# Patient Record
Sex: Male | Born: 1955 | Race: White | Hispanic: No | Marital: Married | Smoking: Never smoker
Health system: Southern US, Community
[De-identification: ages and names within clinical notes are randomized; demographics above are authoritative.]

## PROBLEM LIST (undated history)

## (undated) DIAGNOSIS — I10 Essential (primary) hypertension: Secondary | ICD-10-CM

## (undated) HISTORY — PX: OTHER SURGICAL HISTORY: SHX169

---

## 2015-08-03 ENCOUNTER — Ambulatory Visit
Admission: RE | Admit: 2015-08-03 | Discharge: 2015-08-03 | Disposition: A | Discharge status: Home / Self Care | Payer: PRIVATE HEALTH INSURANCE | Source: Ambulatory Visit | Attending: Nurse Practitioner | Admitting: Nurse Practitioner

## 2015-08-03 ENCOUNTER — Other Ambulatory Visit: Payer: Self-pay | Admitting: Nurse Practitioner

## 2015-08-03 DIAGNOSIS — G542 Cervical root disorders, not elsewhere classified: Secondary | ICD-10-CM

## 2016-07-05 ENCOUNTER — Other Ambulatory Visit: Payer: Self-pay | Admitting: Gastroenterology

## 2016-08-24 ENCOUNTER — Encounter (HOSPITAL_COMMUNITY): Payer: Self-pay

## 2016-08-28 ENCOUNTER — Ambulatory Visit (HOSPITAL_COMMUNITY): Payer: PRIVATE HEALTH INSURANCE | Admitting: Anesthesiology

## 2016-08-28 ENCOUNTER — Ambulatory Visit (HOSPITAL_COMMUNITY)
Admission: RE | Admit: 2016-08-28 | Discharge: 2016-08-28 | Disposition: A | Discharge status: Home / Self Care | Payer: PRIVATE HEALTH INSURANCE | Source: Ambulatory Visit | Attending: Gastroenterology | Admitting: Gastroenterology

## 2016-08-28 ENCOUNTER — Encounter (HOSPITAL_COMMUNITY): Payer: Self-pay

## 2016-08-28 ENCOUNTER — Encounter (HOSPITAL_COMMUNITY)
Admission: RE | Disposition: A | Discharge status: Home / Self Care | Payer: Self-pay | Source: Ambulatory Visit | Attending: Gastroenterology

## 2016-08-28 DIAGNOSIS — I10 Essential (primary) hypertension: Secondary | ICD-10-CM | POA: Insufficient documentation

## 2016-08-28 DIAGNOSIS — Z7982 Long term (current) use of aspirin: Secondary | ICD-10-CM | POA: Diagnosis not present

## 2016-08-28 DIAGNOSIS — E7800 Pure hypercholesterolemia, unspecified: Secondary | ICD-10-CM | POA: Insufficient documentation

## 2016-08-28 DIAGNOSIS — Z79899 Other long term (current) drug therapy: Secondary | ICD-10-CM | POA: Insufficient documentation

## 2016-08-28 DIAGNOSIS — Z880 Allergy status to penicillin: Secondary | ICD-10-CM | POA: Diagnosis not present

## 2016-08-28 DIAGNOSIS — Z1211 Encounter for screening for malignant neoplasm of colon: Secondary | ICD-10-CM | POA: Insufficient documentation

## 2016-08-28 DIAGNOSIS — K5730 Diverticulosis of large intestine without perforation or abscess without bleeding: Secondary | ICD-10-CM | POA: Diagnosis not present

## 2016-08-28 HISTORY — DX: Essential (primary) hypertension: I10

## 2016-08-28 HISTORY — PX: COLONOSCOPY WITH PROPOFOL: SHX5780

## 2016-08-28 SURGERY — COLONOSCOPY WITH PROPOFOL
Anesthesia: Monitor Anesthesia Care

## 2016-08-28 MED ORDER — LIDOCAINE 2% (20 MG/ML) 5 ML SYRINGE
INTRAMUSCULAR | Status: AC
  Filled 2016-08-28: qty 5

## 2016-08-28 MED ORDER — LACTATED RINGERS IV SOLN
INTRAVENOUS | Status: DC
  Administered 2016-08-28: 1000 mL via INTRAVENOUS

## 2016-08-28 MED ORDER — PROPOFOL 500 MG/50ML IV EMUL
INTRAVENOUS | Status: DC | PRN
  Administered 2016-08-28: 150 ug/kg/min via INTRAVENOUS

## 2016-08-28 MED ORDER — PROPOFOL 10 MG/ML IV BOLUS
INTRAVENOUS | Status: DC | PRN
  Administered 2016-08-28: 30 mg via INTRAVENOUS
  Administered 2016-08-28: 20 mg via INTRAVENOUS

## 2016-08-28 MED ORDER — LIDOCAINE 2% (20 MG/ML) 5 ML SYRINGE
INTRAMUSCULAR | Status: DC | PRN
  Administered 2016-08-28: 60 mg via INTRAVENOUS

## 2016-08-28 MED ORDER — PROPOFOL 10 MG/ML IV BOLUS
INTRAVENOUS | Status: AC
  Filled 2016-08-28: qty 60

## 2016-08-28 MED ORDER — SODIUM CHLORIDE 0.9 % IV SOLN: INTRAVENOUS | Status: DC

## 2016-08-28 SURGICAL SUPPLY — 21 items

## 2016-08-28 NOTE — H&P (Signed)
Procedure: Screening colonoscopy. Normal screening colonoscopy was performed on 04/16/2006  History: The patient is a 61 year old male born 06-Jun-1956. He is scheduled to undergo a repeat screening colonoscopy today.  Past medical history: Hypercholesterolemia. Hypertension.  Medication allergies: Penicillin  Exam: The patient is alert and lying comfortably on the endoscopy stretcher. Abdomen is soft and nontender to palpation. Lungs are clear to auscultation. Cardiac exam reveals a regular rhythm.  Plan: Proceed with screening colonoscopy

## 2016-08-28 NOTE — Anesthesia Procedure Notes (Signed)
Procedure Name: MAC Date/Time: 08/28/2016 8:50 AM Performed by: Dione Booze Pre-anesthesia Checklist: Patient identified, Emergency Drugs available, Suction available and Patient being monitored Patient Re-evaluated:Patient Re-evaluated prior to inductionOxygen Delivery Method: Simple face mask Placement Confirmation: positive ETCO2

## 2016-08-28 NOTE — Anesthesia Preprocedure Evaluation (Signed)
Anesthesia Evaluation  Patient identified by MRN, date of birth, ID band Patient awake    Reviewed: Allergy & Precautions, NPO status , Patient's Chart, lab work & pertinent test results  Airway Mallampati: II  TM Distance: >3 FB Neck ROM: Full    Dental  (+) Dental Advisory Given   Pulmonary neg pulmonary ROS,    breath sounds clear to auscultation       Cardiovascular hypertension, Pt. on medications  Rhythm:Regular Rate:Normal     Neuro/Psych negative neurological ROS     GI/Hepatic negative GI ROS, Neg liver ROS,   Endo/Other  negative endocrine ROS  Renal/GU negative Renal ROS     Musculoskeletal   Abdominal   Peds  Hematology negative hematology ROS (+)   Anesthesia Other Findings   Reproductive/Obstetrics                             Anesthesia Physical Anesthesia Plan  ASA: II  Anesthesia Plan: MAC   Post-op Pain Management:    Induction: Intravenous  Airway Management Planned: Simple Face Mask and Natural Airway  Additional Equipment:   Intra-op Plan:   Post-operative Plan:   Informed Consent: I have reviewed the patients History and Physical, chart, labs and discussed the procedure including the risks, benefits and alternatives for the proposed anesthesia with the patient or authorized representative who has indicated his/her understanding and acceptance.     Plan Discussed with: CRNA  Anesthesia Plan Comments:         Anesthesia Quick Evaluation

## 2016-08-28 NOTE — Discharge Instructions (Signed)

## 2016-08-28 NOTE — Transfer of Care (Signed)
Immediate Anesthesia Transfer of Care Note  Patient: Luke Nichols  Procedure(s) Performed: Procedure(s): COLONOSCOPY WITH PROPOFOL (N/A)  Patient Location: PACU and Endoscopy Unit  Anesthesia Type:MAC  Level of Consciousness: sedated and patient cooperative  Airway & Oxygen Therapy: Patient Spontanous Breathing and Patient connected to face mask oxygen  Post-op Assessment: Report given to RN and Post -op Vital signs reviewed and stable  Post vital signs: Reviewed and stable  Last Vitals:  Vitals:   08/28/16 0838  BP: (!) 192/97  Pulse: 88  Resp: 20  Temp: 36.7 C    Last Pain:  Vitals:   08/28/16 0838  TempSrc: Oral         Complications: No apparent anesthesia complications

## 2016-08-28 NOTE — Anesthesia Postprocedure Evaluation (Signed)
Anesthesia Post Note  Patient: Luke Nichols  Procedure(s) Performed: Procedure(s) (LRB): COLONOSCOPY WITH PROPOFOL (N/A)  Patient location during evaluation: PACU Anesthesia Type: MAC Level of consciousness: awake and alert Pain management: pain level controlled Vital Signs Assessment: post-procedure vital signs reviewed and stable Respiratory status: spontaneous breathing, nonlabored ventilation, respiratory function stable and patient connected to nasal cannula oxygen Cardiovascular status: stable and blood pressure returned to baseline Anesthetic complications: no       Last Vitals:  Vitals:   08/28/16 0919 08/28/16 1000  BP:    Pulse: (!) 58 (!) 59  Resp: 14   Temp:      Last Pain:  Vitals:   08/28/16 0838  TempSrc: Leonard Schwartz

## 2016-08-28 NOTE — Op Note (Signed)
[facility] Patient Name: Luke Nichols Procedure Date: 08/28/2016 MRN: [id number] Attending MD: Garlan Fair , MD Date of Birth: 31-Dec-1955 CSN: [id number] Age: 61 Admit Type: Outpatient Procedure:                Colonoscopy Indications:              Screening for colorectal malignant neoplasm Providers:                Garlan Fair, MD, Laverta Baltimore RN, RN,                            Ralene Bathe, Technician Referring MD:              Medicines:                Propofol per Anesthesia Complications:            No immediate complications. Estimated Blood Loss:     Estimated blood loss: none. Procedure:                Pre-Anesthesia Assessment:                           - Prior to the procedure, a History and Physical                            was performed, and patient medications and                            allergies were reviewed. The patient's tolerance of                            previous anesthesia was also reviewed. The risks                            and benefits of the procedure and the sedation                            options and risks were discussed with the patient.                            All questions were answered, and informed consent                            was obtained. Prior Anticoagulants: The patient has                            taken aspirin, last dose was 1 day prior to                            procedure. ASA Grade Assessment: II - A patient                            with mild systemic disease. After reviewing the  risks and benefits, the patient was deemed in                            satisfactory condition to undergo the procedure.                           After obtaining informed consent, the colonoscope                            was passed under direct vision. Throughout the                            procedure, the patient's blood pressure, pulse, and                            oxygen  saturations were monitored continuously. The                            EC-[id number]) scope was introduced through                            the anus and advanced to the the cecum, identified                            by appendiceal orifice and ileocecal valve. The                            colonoscopy was performed without difficulty. The                            patient tolerated the procedure well. The quality                            of the bowel preparation was good. The ileocecal                            valve, the appendiceal orifice and the rectum were                            photographed. Scope In: 8:54:14 AM Scope Out: 9:12:25 AM Scope Withdrawal Time: 0 hours 11 minutes 49 seconds  Total Procedure Duration: 0 hours 18 minutes 11 seconds  Findings:      The perianal and digital rectal examinations were normal.      The entire examined colon appeared normal. Left colonic diverticulosis       was present. Impression:               - The entire examined colon is normal.                           - No specimens collected. Moderate Sedation:      N/A- Per Anesthesia Care Recommendation:           - Patient has a contact number available for  emergencies. The signs and symptoms of potential                            delayed complications were discussed with the                            patient. Return to normal activities tomorrow.                            Written discharge instructions were provided to the                            patient.                           - Repeat colonoscopy in 10 years for screening                            purposes.                           - Resume previous diet.                           - Continue present medications. Procedure Code(s):        --- Professional ---                           [id number], Colorectal cancer screening; colonoscopy on                            individual not meeting  criteria for high risk Diagnosis Code(s):        --- Professional ---                           Z12.11, Encounter for screening for malignant                            neoplasm of colon CPT copyright 2016 [employer]. All rights reserved. The codes documented in this report are preliminary and upon coder review may  be revised to meet current compliance requirements. Earle Gell, MD Garlan Fair, MD 08/28/2016 9:18:22 AM This report has been signed electronically. Number of Addenda: 0

## 2016-08-29 ENCOUNTER — Encounter (HOSPITAL_COMMUNITY): Payer: Self-pay | Admitting: Gastroenterology

## 2016-12-26 ENCOUNTER — Ambulatory Visit (INDEPENDENT_AMBULATORY_CARE_PROVIDER_SITE_OTHER): Payer: PRIVATE HEALTH INSURANCE | Admitting: Podiatry

## 2016-12-26 ENCOUNTER — Ambulatory Visit (INDEPENDENT_AMBULATORY_CARE_PROVIDER_SITE_OTHER): Payer: PRIVATE HEALTH INSURANCE

## 2016-12-26 ENCOUNTER — Encounter: Payer: Self-pay | Admitting: Podiatry

## 2016-12-26 VITALS — Resp 16 | Ht 65.0 in | Wt 190.0 lb

## 2016-12-26 DIAGNOSIS — M79672 Pain in left foot: Secondary | ICD-10-CM

## 2016-12-26 DIAGNOSIS — M722 Plantar fascial fibromatosis: Secondary | ICD-10-CM

## 2016-12-26 MED ORDER — TRIAMCINOLONE ACETONIDE 10 MG/ML IJ SUSP: 10.0000 mg | Freq: Once | INTRAMUSCULAR | Status: AC

## 2016-12-26 MED ORDER — TRIAMCINOLONE ACETONIDE 10 MG/ML IJ SUSP
10.0000 mg | Freq: Once | INTRAMUSCULAR | Status: AC
  Administered 2016-12-26: 10 mg

## 2016-12-26 MED ORDER — DICLOFENAC SODIUM 75 MG PO TBEC: 75.0000 mg | DELAYED_RELEASE_TABLET | Freq: Two times a day (BID) | ORAL | 2 refills | Status: DC

## 2016-12-26 NOTE — Progress Notes (Signed)
Subjective:    Patient ID: Luke Nichols Dec, male   DOB: 61 y.o.   MRN: [id number]   HPI patient presents stating the bottom my left heel has been hurting a lot for over a month and it's getting worse over that time. States she's tried to reduce activity and shoe gear modifications    Review of Systems  All other systems reviewed and are negative.       Objective:  Physical Exam  Constitutional: He is oriented to person, place, and time.  Cardiovascular: Intact distal pulses.   Musculoskeletal: Normal range of motion.  Neurological: He is alert and oriented to person, place, and time.  Skin: Skin is warm.  Nursing note and vitals reviewed.  neurovascular status intact muscle strength was adequate range of motion within normal limits with exquisite discomfort plantar aspect left heel at the insertional point tendon into the calcaneus with inflammation fluid around the medial band. Patient's found have mild depression of the arch and was found have good digital perfusion and well oriented 3     Assessment:   Acute plantar fasciitis left with inflammation fluid around the medial band      Plan:    H&P x-rays reviewed and injected the plantar fashion 3 mg Kenalog 5 mill grams Xylocaine dispensed fascial brace with instructions and gave instructions on physical therapy. Reappoint for [place] to recheck again in the next several weeks and placed on diclofenac 75 mg twice a day  X-ray report indicates spur formation of the small nature with no indication of stress fracture arthritis

## 2016-12-26 NOTE — Progress Notes (Signed)
   Subjective:    Patient ID: Luke Nichols, male    DOB: 07-31-1956, 61 y.o.   MRN: [id number]  HPI Chief Complaint  Patient presents with  . Foot Pain    Left foot; bottom of heel; pt stated, "Hurts more in the morning"; x1+ months      Review of Systems  All other systems reviewed and are negative.      Objective:   Physical Exam        Assessment & Plan:

## 2016-12-26 NOTE — Patient Instructions (Signed)

## 2017-01-09 ENCOUNTER — Ambulatory Visit (INDEPENDENT_AMBULATORY_CARE_PROVIDER_SITE_OTHER): Payer: PRIVATE HEALTH INSURANCE | Admitting: Podiatry

## 2017-01-09 ENCOUNTER — Encounter: Payer: Self-pay | Admitting: Podiatry

## 2017-01-09 DIAGNOSIS — M722 Plantar fascial fibromatosis: Secondary | ICD-10-CM | POA: Diagnosis not present

## 2017-01-09 MED ORDER — TRIAMCINOLONE ACETONIDE 10 MG/ML IJ SUSP
10.0000 mg | Freq: Once | INTRAMUSCULAR | Status: AC
  Administered 2017-01-09: 10 mg

## 2017-01-14 NOTE — Progress Notes (Signed)
Subjective:    Patient ID: Luke Nichols, male   DOB: 61 y.o.   MRN: [id number]   HPI patient states the foot feels better but I still am getting some discomfort    ROS      Objective:  Physical Exam Neurovascular status intact with discomfort on the plantar fascial insertion into the heel left with fluid buildup of a mild to moderate nature    Assessment:    Plantar fasciitis present but improved     Plan:    Injected the left plantar fashion 3 mg Kenalog 5 mg Xylocaine advised on physical therapy and reappoint to recheck

## 2017-03-22 ENCOUNTER — Other Ambulatory Visit: Payer: Self-pay | Admitting: Podiatry

## 2017-03-22 NOTE — Telephone Encounter (Signed)
Pt needs an appt prior to future refills. 

## 2017-11-18 IMAGING — CR DG CERVICAL SPINE 2 OR 3 VIEWS
4 series · 4 of 4 positions shown · non-contrast
Comparison: None.

CLINICAL DATA: LEFT shoulder and elbow pain.

EXAM:
CERVICAL SPINE - 2-3 VIEW

[w c-spine lat]
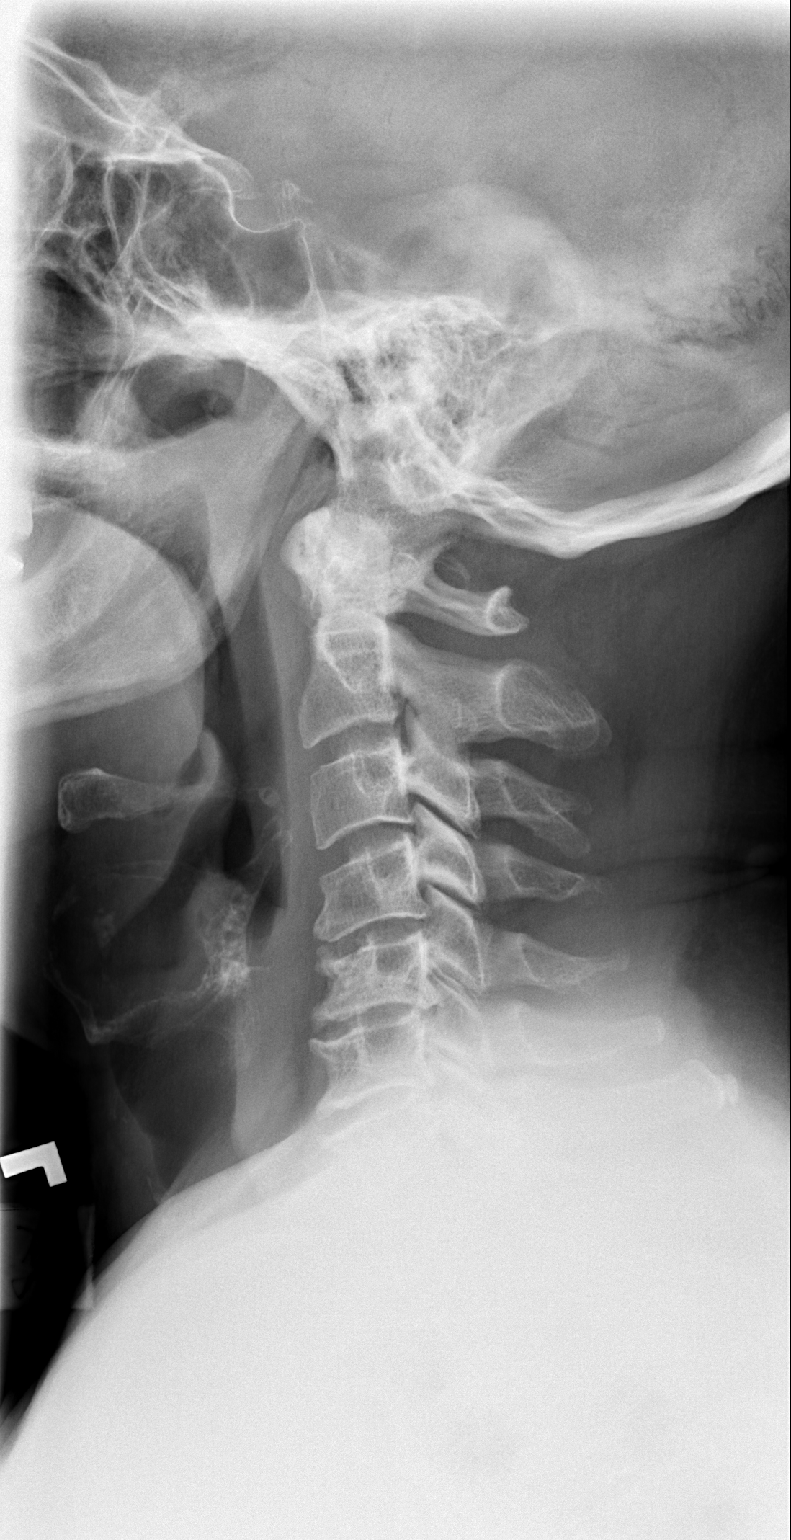

[w swimmers view *]
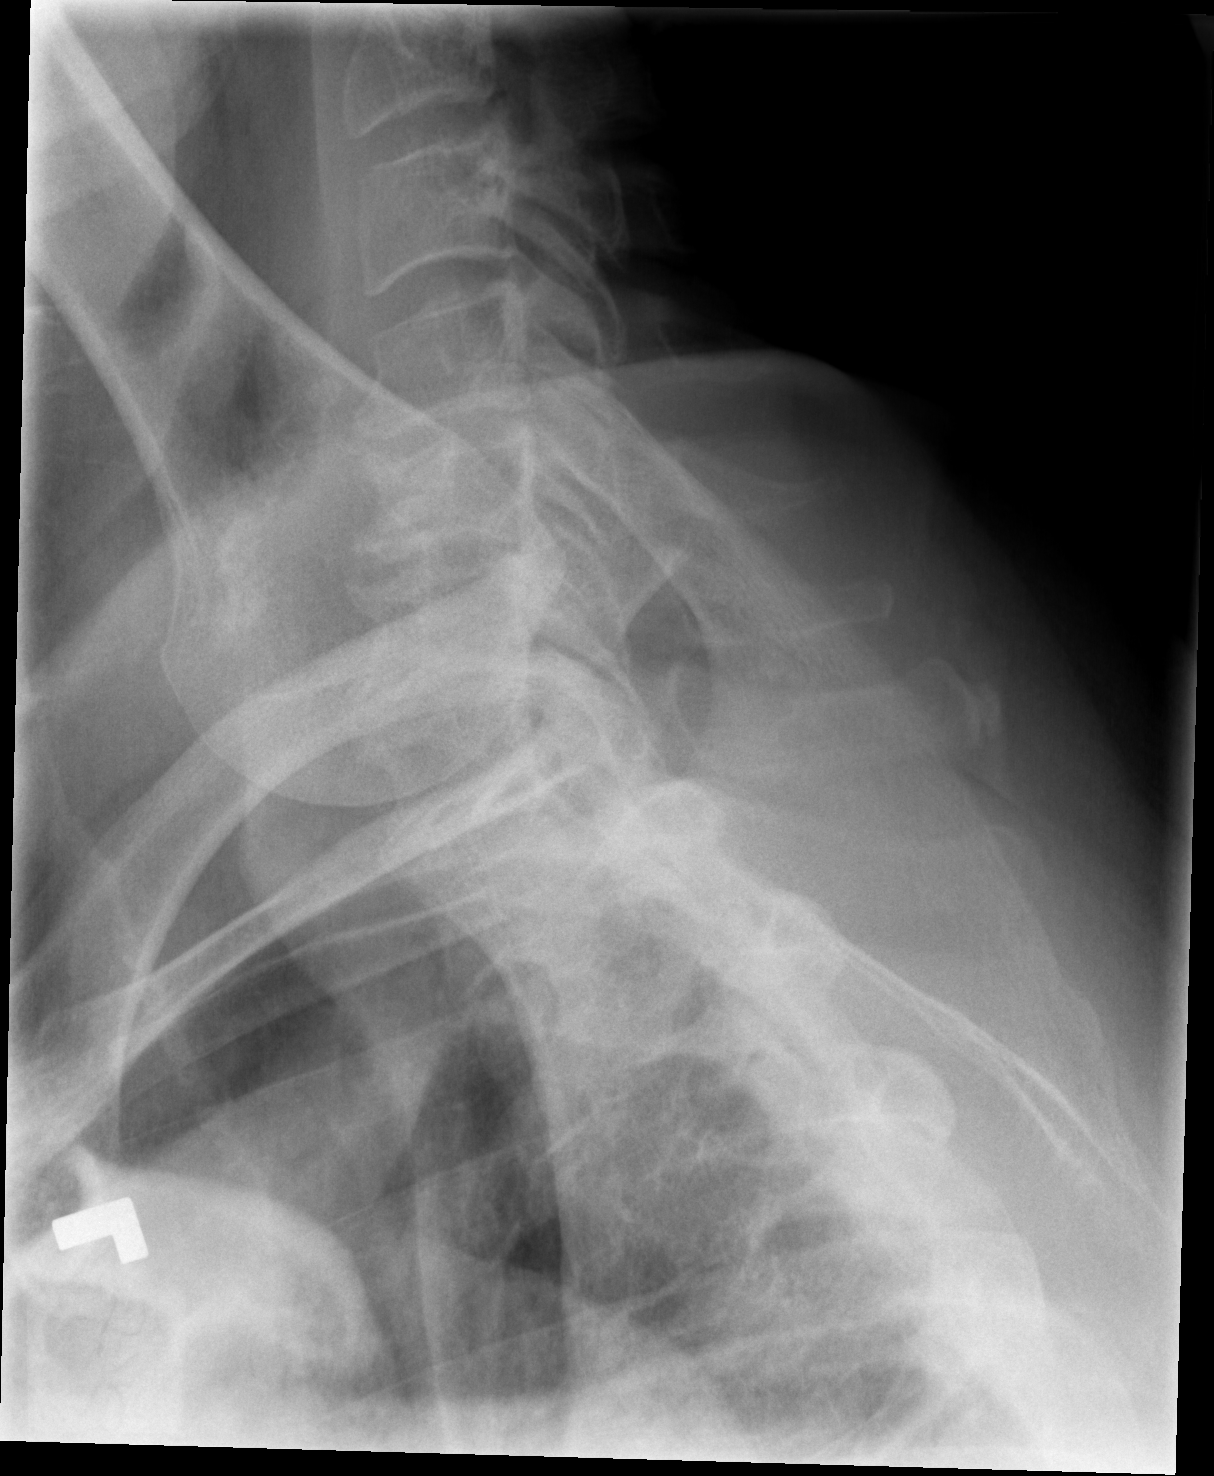

[w c-spine a.p.]
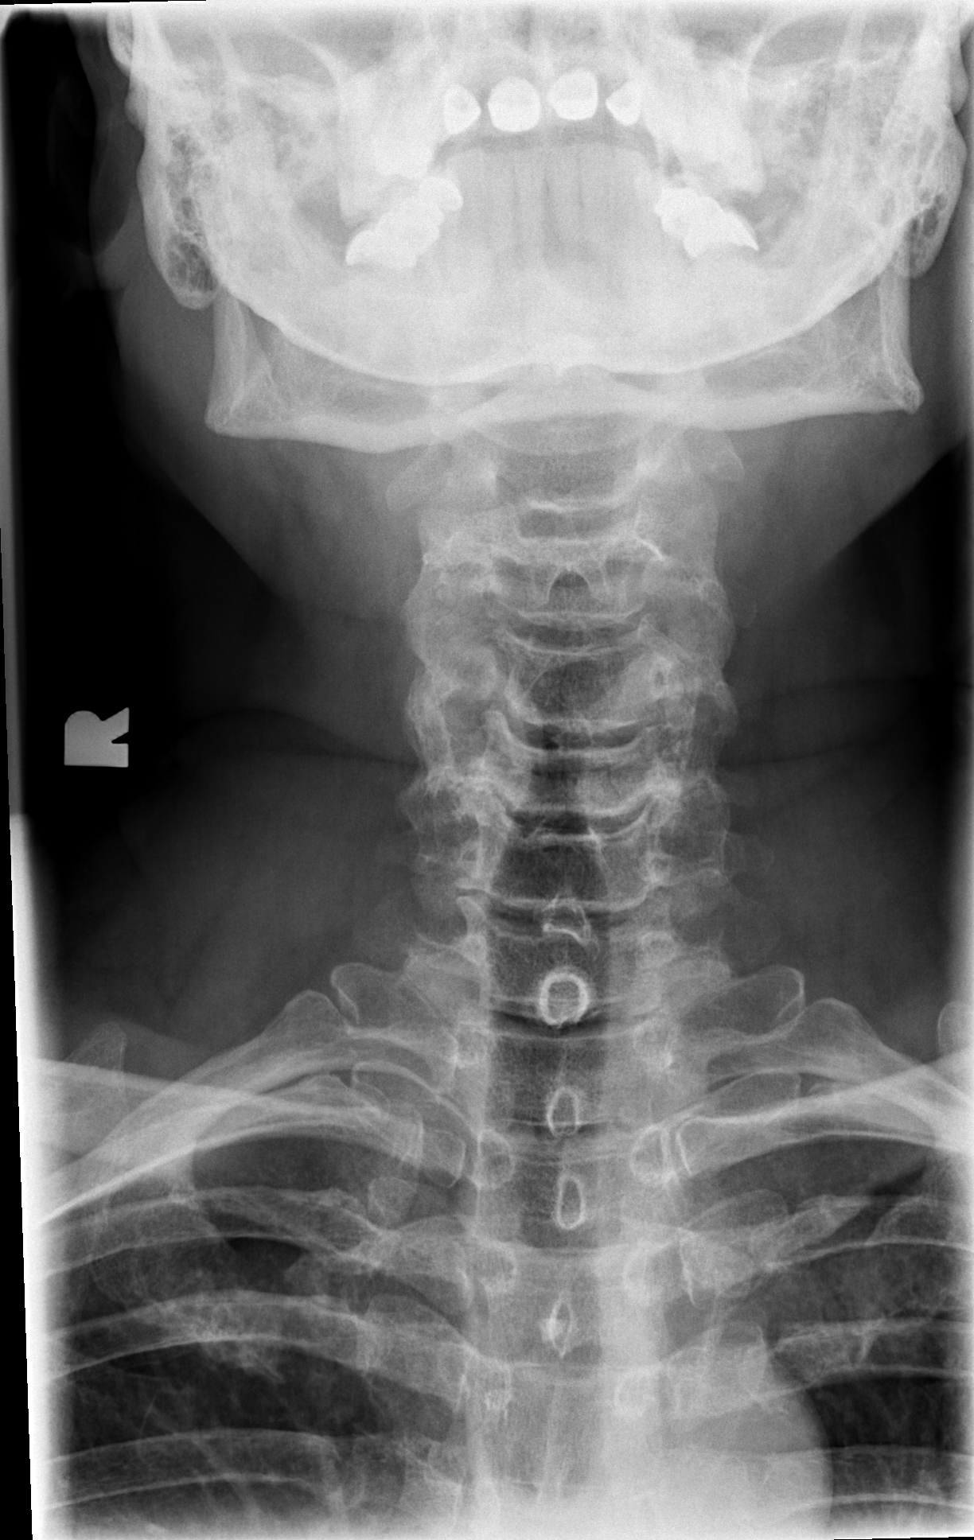

[w c-spine odontoid]
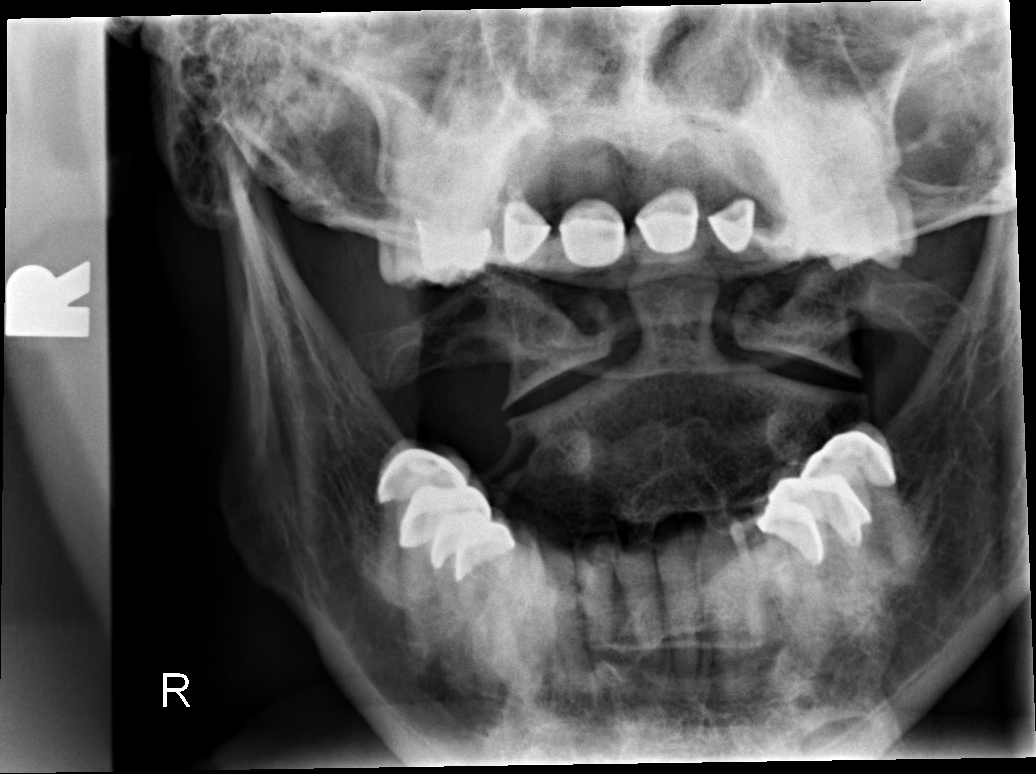

[4 of 4 positions shown; findings below may reference images not displayed]

FINDINGS: There is no evidence of cervical spine fracture or prevertebral soft
tissue swelling. There is reversal of the normal cervical lordotic
curve. There is disc space narrowing C4-5, C5-6, and C6-7.
IMPRESSION: No acute findings.  Spondylosis.

## 2021-01-30 DIAGNOSIS — I10 Essential (primary) hypertension: Secondary | ICD-10-CM | POA: Diagnosis not present

## 2021-01-30 DIAGNOSIS — Z79899 Other long term (current) drug therapy: Secondary | ICD-10-CM | POA: Diagnosis not present

## 2021-07-05 DIAGNOSIS — I10 Essential (primary) hypertension: Secondary | ICD-10-CM | POA: Diagnosis not present

## 2021-07-05 DIAGNOSIS — Z6831 Body mass index (BMI) 31.0-31.9, adult: Secondary | ICD-10-CM | POA: Diagnosis not present

## 2021-07-05 DIAGNOSIS — Z880 Allergy status to penicillin: Secondary | ICD-10-CM | POA: Diagnosis not present

## 2021-07-05 DIAGNOSIS — Z820 Family history of epilepsy and other diseases of the nervous system: Secondary | ICD-10-CM | POA: Diagnosis not present

## 2021-07-05 DIAGNOSIS — E785 Hyperlipidemia, unspecified: Secondary | ICD-10-CM | POA: Diagnosis not present

## 2021-07-05 DIAGNOSIS — Z008 Encounter for other general examination: Secondary | ICD-10-CM | POA: Diagnosis not present

## 2021-07-05 DIAGNOSIS — Z8249 Family history of ischemic heart disease and other diseases of the circulatory system: Secondary | ICD-10-CM | POA: Diagnosis not present

## 2021-07-05 DIAGNOSIS — E669 Obesity, unspecified: Secondary | ICD-10-CM | POA: Diagnosis not present

## 2021-08-11 DIAGNOSIS — H2513 Age-related nuclear cataract, bilateral: Secondary | ICD-10-CM | POA: Diagnosis not present

## 2021-09-13 DIAGNOSIS — I10 Essential (primary) hypertension: Secondary | ICD-10-CM | POA: Diagnosis not present

## 2021-09-13 DIAGNOSIS — E7800 Pure hypercholesterolemia, unspecified: Secondary | ICD-10-CM | POA: Diagnosis not present

## 2021-09-13 DIAGNOSIS — Z125 Encounter for screening for malignant neoplasm of prostate: Secondary | ICD-10-CM | POA: Diagnosis not present

## 2021-09-13 DIAGNOSIS — Z79899 Other long term (current) drug therapy: Secondary | ICD-10-CM | POA: Diagnosis not present

## 2021-09-13 DIAGNOSIS — Z0000 Encounter for general adult medical examination without abnormal findings: Secondary | ICD-10-CM | POA: Diagnosis not present

## 2021-09-13 DIAGNOSIS — L989 Disorder of the skin and subcutaneous tissue, unspecified: Secondary | ICD-10-CM | POA: Diagnosis not present

## 2021-09-13 DIAGNOSIS — Z23 Encounter for immunization: Secondary | ICD-10-CM | POA: Diagnosis not present

## 2021-12-11 DIAGNOSIS — C44319 Basal cell carcinoma of skin of other parts of face: Secondary | ICD-10-CM | POA: Diagnosis not present

## 2021-12-11 DIAGNOSIS — L720 Epidermal cyst: Secondary | ICD-10-CM | POA: Diagnosis not present

## 2021-12-11 DIAGNOSIS — D2362 Other benign neoplasm of skin of left upper limb, including shoulder: Secondary | ICD-10-CM | POA: Diagnosis not present

## 2021-12-11 DIAGNOSIS — D1801 Hemangioma of skin and subcutaneous tissue: Secondary | ICD-10-CM | POA: Diagnosis not present

## 2021-12-11 DIAGNOSIS — L814 Other melanin hyperpigmentation: Secondary | ICD-10-CM | POA: Diagnosis not present

## 2022-01-03 DIAGNOSIS — C44319 Basal cell carcinoma of skin of other parts of face: Secondary | ICD-10-CM | POA: Diagnosis not present

## 2022-03-20 DIAGNOSIS — I10 Essential (primary) hypertension: Secondary | ICD-10-CM | POA: Diagnosis not present

## 2022-03-20 DIAGNOSIS — Z79899 Other long term (current) drug therapy: Secondary | ICD-10-CM | POA: Diagnosis not present

## 2022-04-10 DIAGNOSIS — E785 Hyperlipidemia, unspecified: Secondary | ICD-10-CM | POA: Diagnosis not present

## 2022-04-10 DIAGNOSIS — E669 Obesity, unspecified: Secondary | ICD-10-CM | POA: Diagnosis not present

## 2022-04-10 DIAGNOSIS — Z85828 Personal history of other malignant neoplasm of skin: Secondary | ICD-10-CM | POA: Diagnosis not present

## 2022-04-10 DIAGNOSIS — I10 Essential (primary) hypertension: Secondary | ICD-10-CM | POA: Diagnosis not present

## 2022-04-10 DIAGNOSIS — Z880 Allergy status to penicillin: Secondary | ICD-10-CM | POA: Diagnosis not present

## 2022-04-10 DIAGNOSIS — I951 Orthostatic hypotension: Secondary | ICD-10-CM | POA: Diagnosis not present

## 2022-04-10 DIAGNOSIS — Z6832 Body mass index (BMI) 32.0-32.9, adult: Secondary | ICD-10-CM | POA: Diagnosis not present

## 2022-08-15 DIAGNOSIS — H2513 Age-related nuclear cataract, bilateral: Secondary | ICD-10-CM | POA: Diagnosis not present

## 2022-11-12 DIAGNOSIS — Z125 Encounter for screening for malignant neoplasm of prostate: Secondary | ICD-10-CM | POA: Diagnosis not present

## 2022-11-12 DIAGNOSIS — Z79899 Other long term (current) drug therapy: Secondary | ICD-10-CM | POA: Diagnosis not present

## 2022-11-12 DIAGNOSIS — E6609 Other obesity due to excess calories: Secondary | ICD-10-CM | POA: Diagnosis not present

## 2022-11-12 DIAGNOSIS — E7800 Pure hypercholesterolemia, unspecified: Secondary | ICD-10-CM | POA: Diagnosis not present

## 2022-11-12 DIAGNOSIS — Z0000 Encounter for general adult medical examination without abnormal findings: Secondary | ICD-10-CM | POA: Diagnosis not present

## 2022-11-12 DIAGNOSIS — I10 Essential (primary) hypertension: Secondary | ICD-10-CM | POA: Diagnosis not present

## 2022-11-12 DIAGNOSIS — Z1331 Encounter for screening for depression: Secondary | ICD-10-CM | POA: Diagnosis not present

## 2022-11-12 DIAGNOSIS — Z6833 Body mass index (BMI) 33.0-33.9, adult: Secondary | ICD-10-CM | POA: Diagnosis not present

## 2022-11-12 DIAGNOSIS — N529 Male erectile dysfunction, unspecified: Secondary | ICD-10-CM | POA: Diagnosis not present

## 2023-01-11 DIAGNOSIS — D235 Other benign neoplasm of skin of trunk: Secondary | ICD-10-CM | POA: Diagnosis not present

## 2023-01-11 DIAGNOSIS — L570 Actinic keratosis: Secondary | ICD-10-CM | POA: Diagnosis not present

## 2023-01-11 DIAGNOSIS — L814 Other melanin hyperpigmentation: Secondary | ICD-10-CM | POA: Diagnosis not present

## 2023-01-11 DIAGNOSIS — D485 Neoplasm of uncertain behavior of skin: Secondary | ICD-10-CM | POA: Diagnosis not present

## 2023-01-11 DIAGNOSIS — D1801 Hemangioma of skin and subcutaneous tissue: Secondary | ICD-10-CM | POA: Diagnosis not present

## 2023-01-11 DIAGNOSIS — D2261 Melanocytic nevi of right upper limb, including shoulder: Secondary | ICD-10-CM | POA: Diagnosis not present

## 2023-01-11 DIAGNOSIS — Z85828 Personal history of other malignant neoplasm of skin: Secondary | ICD-10-CM | POA: Diagnosis not present

## 2023-01-11 DIAGNOSIS — D225 Melanocytic nevi of trunk: Secondary | ICD-10-CM | POA: Diagnosis not present

## 2023-01-11 DIAGNOSIS — D2262 Melanocytic nevi of left upper limb, including shoulder: Secondary | ICD-10-CM | POA: Diagnosis not present

## 2023-05-15 DIAGNOSIS — Z79899 Other long term (current) drug therapy: Secondary | ICD-10-CM | POA: Diagnosis not present

## 2023-05-15 DIAGNOSIS — E7800 Pure hypercholesterolemia, unspecified: Secondary | ICD-10-CM | POA: Diagnosis not present

## 2023-08-30 DIAGNOSIS — H2513 Age-related nuclear cataract, bilateral: Secondary | ICD-10-CM | POA: Diagnosis not present

## 2023-08-30 DIAGNOSIS — H52203 Unspecified astigmatism, bilateral: Secondary | ICD-10-CM | POA: Diagnosis not present

## 2023-08-30 DIAGNOSIS — H524 Presbyopia: Secondary | ICD-10-CM | POA: Diagnosis not present

## 2023-08-30 DIAGNOSIS — H04123 Dry eye syndrome of bilateral lacrimal glands: Secondary | ICD-10-CM | POA: Diagnosis not present

## 2023-08-30 DIAGNOSIS — D23121 Other benign neoplasm of skin of left upper eyelid, including canthus: Secondary | ICD-10-CM | POA: Diagnosis not present

## 2023-11-14 DIAGNOSIS — Z1331 Encounter for screening for depression: Secondary | ICD-10-CM | POA: Diagnosis not present

## 2023-11-14 DIAGNOSIS — Z79899 Other long term (current) drug therapy: Secondary | ICD-10-CM | POA: Diagnosis not present

## 2023-11-14 DIAGNOSIS — E7800 Pure hypercholesterolemia, unspecified: Secondary | ICD-10-CM | POA: Diagnosis not present

## 2023-11-14 DIAGNOSIS — Z0000 Encounter for general adult medical examination without abnormal findings: Secondary | ICD-10-CM | POA: Diagnosis not present

## 2023-11-14 DIAGNOSIS — Z125 Encounter for screening for malignant neoplasm of prostate: Secondary | ICD-10-CM | POA: Diagnosis not present

## 2024-01-14 DIAGNOSIS — D2372 Other benign neoplasm of skin of left lower limb, including hip: Secondary | ICD-10-CM | POA: Diagnosis not present

## 2024-01-14 DIAGNOSIS — D225 Melanocytic nevi of trunk: Secondary | ICD-10-CM | POA: Diagnosis not present

## 2024-01-14 DIAGNOSIS — L570 Actinic keratosis: Secondary | ICD-10-CM | POA: Diagnosis not present

## 2024-01-14 DIAGNOSIS — Z85828 Personal history of other malignant neoplasm of skin: Secondary | ICD-10-CM | POA: Diagnosis not present

## 2024-01-14 DIAGNOSIS — D2371 Other benign neoplasm of skin of right lower limb, including hip: Secondary | ICD-10-CM | POA: Diagnosis not present

## 2024-01-14 DIAGNOSIS — D1801 Hemangioma of skin and subcutaneous tissue: Secondary | ICD-10-CM | POA: Diagnosis not present

## 2024-01-14 DIAGNOSIS — D2362 Other benign neoplasm of skin of left upper limb, including shoulder: Secondary | ICD-10-CM | POA: Diagnosis not present

## 2024-03-26 DIAGNOSIS — I10 Essential (primary) hypertension: Secondary | ICD-10-CM | POA: Diagnosis not present

## 2024-03-26 DIAGNOSIS — E7800 Pure hypercholesterolemia, unspecified: Secondary | ICD-10-CM | POA: Diagnosis not present

## 2024-03-26 DIAGNOSIS — E6609 Other obesity due to excess calories: Secondary | ICD-10-CM | POA: Diagnosis not present

## 2024-04-26 DIAGNOSIS — I10 Essential (primary) hypertension: Secondary | ICD-10-CM | POA: Diagnosis not present

## 2024-04-26 DIAGNOSIS — E6609 Other obesity due to excess calories: Secondary | ICD-10-CM | POA: Diagnosis not present

## 2024-04-26 DIAGNOSIS — E7800 Pure hypercholesterolemia, unspecified: Secondary | ICD-10-CM | POA: Diagnosis not present

## 2024-05-18 DIAGNOSIS — M25521 Pain in right elbow: Secondary | ICD-10-CM | POA: Diagnosis not present

## 2024-05-18 DIAGNOSIS — I10 Essential (primary) hypertension: Secondary | ICD-10-CM | POA: Diagnosis not present

## 2024-05-18 DIAGNOSIS — M25522 Pain in left elbow: Secondary | ICD-10-CM | POA: Diagnosis not present

## 2024-05-18 DIAGNOSIS — Z6831 Body mass index (BMI) 31.0-31.9, adult: Secondary | ICD-10-CM | POA: Diagnosis not present

## 2024-05-18 DIAGNOSIS — E6609 Other obesity due to excess calories: Secondary | ICD-10-CM | POA: Diagnosis not present

## 2024-05-26 DIAGNOSIS — E6609 Other obesity due to excess calories: Secondary | ICD-10-CM | POA: Diagnosis not present

## 2024-05-26 DIAGNOSIS — I10 Essential (primary) hypertension: Secondary | ICD-10-CM | POA: Diagnosis not present

## 2024-05-26 DIAGNOSIS — E7800 Pure hypercholesterolemia, unspecified: Secondary | ICD-10-CM | POA: Diagnosis not present

## 2024-06-26 DIAGNOSIS — E7800 Pure hypercholesterolemia, unspecified: Secondary | ICD-10-CM | POA: Diagnosis not present

## 2024-06-26 DIAGNOSIS — E6609 Other obesity due to excess calories: Secondary | ICD-10-CM | POA: Diagnosis not present

## 2024-06-26 DIAGNOSIS — I10 Essential (primary) hypertension: Secondary | ICD-10-CM | POA: Diagnosis not present

## 2024-07-26 DIAGNOSIS — I10 Essential (primary) hypertension: Secondary | ICD-10-CM | POA: Diagnosis not present

## 2024-07-26 DIAGNOSIS — E7800 Pure hypercholesterolemia, unspecified: Secondary | ICD-10-CM | POA: Diagnosis not present

## 2024-07-26 DIAGNOSIS — E6609 Other obesity due to excess calories: Secondary | ICD-10-CM | POA: Diagnosis not present
